# Patient Record
Sex: Male | Born: 1995 | Smoking: Current every day smoker
Health system: Southern US, Community
[De-identification: ages and names within clinical notes are randomized; demographics above are authoritative.]

## PROBLEM LIST (undated history)

## (undated) DIAGNOSIS — K259 Gastric ulcer, unspecified as acute or chronic, without hemorrhage or perforation: Secondary | ICD-10-CM

---
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
## 2023-04-22 DEATH — deceased
